# Patient Record
Sex: Male | Born: 1975 | Race: Black or African American | Hispanic: No | Marital: Single | Smoking: Current every day smoker
Health system: Southern US, Community
[De-identification: ages and names within clinical notes are randomized; demographics above are authoritative.]

## PROBLEM LIST (undated history)

## (undated) DIAGNOSIS — Z789 Other specified health status: Secondary | ICD-10-CM

## (undated) HISTORY — PX: NO PAST SURGERIES: SHX2092

---

## 2007-02-10 ENCOUNTER — Emergency Department: Payer: Self-pay | Admitting: Emergency Medicine

## 2012-05-19 ENCOUNTER — Encounter (HOSPITAL_COMMUNITY): Payer: Self-pay | Admitting: *Deleted

## 2012-05-19 ENCOUNTER — Observation Stay (HOSPITAL_COMMUNITY): Payer: No Typology Code available for payment source | Admitting: Anesthesiology

## 2012-05-19 ENCOUNTER — Emergency Department (HOSPITAL_COMMUNITY): Payer: No Typology Code available for payment source

## 2012-05-19 ENCOUNTER — Observation Stay (HOSPITAL_COMMUNITY)
Admission: EM | Admit: 2012-05-19 | Discharge: 2012-05-20 | Disposition: A | Discharge status: Home / Self Care | Payer: No Typology Code available for payment source | Attending: Surgery | Admitting: Surgery

## 2012-05-19 ENCOUNTER — Encounter (HOSPITAL_COMMUNITY): Payer: Self-pay | Admitting: Anesthesiology

## 2012-05-19 ENCOUNTER — Encounter (HOSPITAL_COMMUNITY)
Admission: EM | Disposition: A | Discharge status: Home / Self Care | Payer: Self-pay | Source: Home / Self Care | Attending: Emergency Medicine

## 2012-05-19 DIAGNOSIS — IMO0002 Reserved for concepts with insufficient information to code with codable children: Secondary | ICD-10-CM | POA: Insufficient documentation

## 2012-05-19 DIAGNOSIS — S66909A Unspecified injury of unspecified muscle, fascia and tendon at wrist and hand level, unspecified hand, initial encounter: Secondary | ICD-10-CM | POA: Insufficient documentation

## 2012-05-19 DIAGNOSIS — S270XXA Traumatic pneumothorax, initial encounter: Secondary | ICD-10-CM | POA: Insufficient documentation

## 2012-05-19 DIAGNOSIS — S61509A Unspecified open wound of unspecified wrist, initial encounter: Secondary | ICD-10-CM | POA: Insufficient documentation

## 2012-05-19 DIAGNOSIS — S61409A Unspecified open wound of unspecified hand, initial encounter: Principal | ICD-10-CM | POA: Insufficient documentation

## 2012-05-19 DIAGNOSIS — S0003XA Contusion of scalp, initial encounter: Secondary | ICD-10-CM | POA: Insufficient documentation

## 2012-05-19 DIAGNOSIS — W268XXA Contact with other sharp object(s), not elsewhere classified, initial encounter: Secondary | ICD-10-CM | POA: Insufficient documentation

## 2012-05-19 DIAGNOSIS — S060X9A Concussion with loss of consciousness of unspecified duration, initial encounter: Secondary | ICD-10-CM | POA: Insufficient documentation

## 2012-05-19 HISTORY — PX: LACERATION REPAIR: SHX5284

## 2012-05-19 HISTORY — PX: I & D EXTREMITY: SHX5045

## 2012-05-19 HISTORY — PX: TENDON REPAIR: SHX5111

## 2012-05-19 HISTORY — DX: Other specified health status: Z78.9

## 2012-05-19 HISTORY — PX: FOREIGN BODY REMOVAL: SHX962

## 2012-05-19 HISTORY — PX: WOUND EXPLORATION: SHX6188

## 2012-05-19 LAB — CREATININE, SERUM
GFR calc Af Amer: >90 mL/min (ref >90)
GFR calc non Af Amer: >90 mL/min (ref >90)

## 2012-05-19 LAB — CBC WITH DIFFERENTIAL/PLATELET
Basophils Relative: 0 % (ref 0–1)
Eosinophils Absolute: 0 10*3/uL (ref 0.0–0.7)
Eosinophils Relative: 0 % (ref 0–5)
HCT: 33.8 % — ABNORMAL LOW (ref 39.0–52.0)
Hemoglobin: 10.9 g/dL — ABNORMAL LOW (ref 13.0–17.0)
MCH: 24.7 pg — ABNORMAL LOW (ref 26.0–34.0)
MCHC: 32.2 g/dL (ref 30.0–36.0)
MCV: 76.5 fL — ABNORMAL LOW (ref 78.0–100.0)
Monocytes Absolute: 1.1 10*3/uL — ABNORMAL HIGH (ref 0.1–1.0)
Monocytes Relative: 7 % (ref 3–12)
Neutro Abs: 12.6 10*3/uL — ABNORMAL HIGH (ref 1.7–7.7)

## 2012-05-19 LAB — CBC
HCT: 32.8 % — ABNORMAL LOW (ref 39.0–52.0)
MCV: 76.6 fL — ABNORMAL LOW (ref 78.0–100.0)
RBC: 4.28 MIL/uL (ref 4.22–5.81)
WBC: 14.3 10*3/uL — ABNORMAL HIGH (ref 4.0–10.5)

## 2012-05-19 LAB — POCT I-STAT, CHEM 8
BUN: 8 mg/dL (ref 6–23)
Chloride: 107 mEq/L (ref 96–112)
Creatinine, Ser: 1.4 mg/dL — ABNORMAL HIGH (ref 0.50–1.35)
Glucose, Bld: 94 mg/dL (ref 70–99)
Potassium: 3.5 mEq/L (ref 3.5–5.1)

## 2012-05-19 LAB — TYPE AND SCREEN

## 2012-05-19 SURGERY — REPAIR, LACERATION, 2 OR MORE
Anesthesia: General | Site: Hand | Laterality: Bilateral | Wound class: Contaminated

## 2012-05-19 MED ORDER — ONDANSETRON HCL 4 MG PO TABS: 4.0000 mg | ORAL_TABLET | Freq: Four times a day (QID) | ORAL | Status: DC | PRN

## 2012-05-19 MED ORDER — HYDROMORPHONE HCL PF 1 MG/ML IJ SOLN
0.2500 mg | INTRAMUSCULAR | Status: DC | PRN
  Administered 2012-05-19 (×4): 0.5 mg via INTRAVENOUS

## 2012-05-19 MED ORDER — SODIUM CHLORIDE 0.9 % IV BOLUS (SEPSIS)
500.0000 mL | Freq: Once | INTRAVENOUS | Status: AC
  Administered 2012-05-19: 500 mL via INTRAVENOUS

## 2012-05-19 MED ORDER — SODIUM CHLORIDE 0.9 % IR SOLN
Status: DC | PRN
  Administered 2012-05-19: 3000 mL

## 2012-05-19 MED ORDER — KCL IN DEXTROSE-NACL 20-5-0.45 MEQ/L-%-% IV SOLN
INTRAVENOUS | Status: DC
  Filled 2012-05-19 (×4): qty 1000

## 2012-05-19 MED ORDER — ONDANSETRON HCL 4 MG/2ML IJ SOLN: 4.0000 mg | Freq: Four times a day (QID) | INTRAMUSCULAR | Status: DC | PRN

## 2012-05-19 MED ORDER — LIDOCAINE HCL (CARDIAC) 20 MG/ML IV SOLN
INTRAVENOUS | Status: DC | PRN
  Administered 2012-05-19: 100 mg via INTRAVENOUS

## 2012-05-19 MED ORDER — CEFAZOLIN SODIUM 1-5 GM-% IV SOLN
1.0000 g | Freq: Three times a day (TID) | INTRAVENOUS | Status: DC
  Administered 2012-05-19 – 2012-05-20 (×2): 1 g via INTRAVENOUS
  Filled 2012-05-19 (×3): qty 50

## 2012-05-19 MED ORDER — PROPOFOL 10 MG/ML IV BOLUS
INTRAVENOUS | Status: DC | PRN
  Administered 2012-05-19: 30 mg via INTRAVENOUS
  Administered 2012-05-19: 170 mg via INTRAVENOUS

## 2012-05-19 MED ORDER — DOCUSATE SODIUM 100 MG PO CAPS
100.0000 mg | ORAL_CAPSULE | Freq: Two times a day (BID) | ORAL | Status: DC
  Administered 2012-05-19 – 2012-05-20 (×2): 100 mg via ORAL
  Filled 2012-05-19 (×2): qty 1

## 2012-05-19 MED ORDER — CEFAZOLIN SODIUM-DEXTROSE 2-3 GM-% IV SOLR
2.0000 g | INTRAVENOUS | Status: DC
  Filled 2012-05-19: qty 50

## 2012-05-19 MED ORDER — MORPHINE SULFATE 2 MG/ML IJ SOLN
1.0000 mg | INTRAMUSCULAR | Status: DC | PRN
  Administered 2012-05-19 (×3): 2 mg via INTRAVENOUS
  Filled 2012-05-19 (×3): qty 1
  Filled 2012-05-19: qty 2

## 2012-05-19 MED ORDER — BUPIVACAINE HCL 0.25 % IJ SOLN
INTRAMUSCULAR | Status: DC | PRN
  Administered 2012-05-19: 13 mL
  Administered 2012-05-19: 5 mL

## 2012-05-19 MED ORDER — MEPERIDINE HCL 25 MG/ML IJ SOLN: 6.2500 mg | INTRAMUSCULAR | Status: DC | PRN

## 2012-05-19 MED ORDER — BISACODYL 10 MG RE SUPP: 10.0000 mg | Freq: Every day | RECTAL | Status: DC | PRN

## 2012-05-19 MED ORDER — SUCCINYLCHOLINE CHLORIDE 20 MG/ML IJ SOLN
INTRAMUSCULAR | Status: DC | PRN
  Administered 2012-05-19: 120 mg via INTRAVENOUS

## 2012-05-19 MED ORDER — HYDROMORPHONE HCL PF 1 MG/ML IJ SOLN
1.0000 mg | Freq: Once | INTRAMUSCULAR | Status: AC
  Administered 2012-05-19: 1 mg via INTRAVENOUS
  Filled 2012-05-19: qty 1

## 2012-05-19 MED ORDER — IOHEXOL 300 MG/ML  SOLN
100.0000 mL | Freq: Once | INTRAMUSCULAR | Status: AC | PRN
  Administered 2012-05-19: 100 mL via INTRAVENOUS

## 2012-05-19 MED ORDER — PROMETHAZINE HCL 25 MG/ML IJ SOLN: 6.2500 mg | INTRAMUSCULAR | Status: DC | PRN

## 2012-05-19 MED ORDER — FENTANYL CITRATE 0.05 MG/ML IJ SOLN
INTRAMUSCULAR | Status: DC | PRN
  Administered 2012-05-19: 50 ug via INTRAVENOUS
  Administered 2012-05-19: 100 ug via INTRAVENOUS
  Administered 2012-05-19: 50 ug via INTRAVENOUS

## 2012-05-19 MED ORDER — OXYCODONE HCL 5 MG PO TABS: 5.0000 mg | ORAL_TABLET | Freq: Once | ORAL | Status: DC | PRN

## 2012-05-19 MED ORDER — HYDROCODONE-ACETAMINOPHEN 5-325 MG PO TABS
2.0000 | ORAL_TABLET | ORAL | Status: DC | PRN
  Administered 2012-05-19 – 2012-05-20 (×4): 2 via ORAL
  Filled 2012-05-19 (×4): qty 2

## 2012-05-19 MED ORDER — CEFAZOLIN SODIUM 1-5 GM-% IV SOLN
1.0000 g | Freq: Once | INTRAVENOUS | Status: AC
  Administered 2012-05-19 (×2): 1 g via INTRAVENOUS
  Filled 2012-05-19: qty 50

## 2012-05-19 MED ORDER — LACTATED RINGERS IV SOLN: INTRAVENOUS | Status: DC | PRN

## 2012-05-19 MED ORDER — HYDROCODONE-ACETAMINOPHEN 5-325 MG PO TABS: 1.0000 | ORAL_TABLET | ORAL | Status: DC | PRN

## 2012-05-19 MED ORDER — CHLORHEXIDINE GLUCONATE 4 % EX LIQD: 60.0000 mL | Freq: Once | CUTANEOUS | Status: DC

## 2012-05-19 MED ORDER — OXYCODONE HCL 5 MG/5ML PO SOLN: 5.0000 mg | Freq: Once | ORAL | Status: DC | PRN

## 2012-05-19 MED ORDER — HYDROCODONE-ACETAMINOPHEN 5-325 MG PO TABS: 0.5000 | ORAL_TABLET | ORAL | Status: DC | PRN

## 2012-05-19 MED ORDER — ENOXAPARIN SODIUM 40 MG/0.4ML ~~LOC~~ SOLN
40.0000 mg | SUBCUTANEOUS | Status: DC
  Administered 2012-05-20: 40 mg via SUBCUTANEOUS
  Filled 2012-05-19 (×2): qty 0.4

## 2012-05-19 MED ORDER — 0.9 % SODIUM CHLORIDE (POUR BTL) OPTIME
TOPICAL | Status: DC | PRN
  Administered 2012-05-19: 1000 mL

## 2012-05-19 SURGICAL SUPPLY — 32 items
BANDAGE ELASTIC 4 VELCRO ST LF (GAUZE/BANDAGES/DRESSINGS) ×12 IMPLANT
BANDAGE GAUZE ELAST BULKY 4 IN (GAUZE/BANDAGES/DRESSINGS) ×6 IMPLANT
BNDG ESMARK 4X9 LF (GAUZE/BANDAGES/DRESSINGS) ×3 IMPLANT
COVER SURGICAL LIGHT HANDLE (MISCELLANEOUS) ×3 IMPLANT
CUFF TOURNIQUET SINGLE 18IN (TOURNIQUET CUFF) ×6 IMPLANT
DRAPE EXTREMITY T 121X128X90 (DRAPE) ×3 IMPLANT
DRAPE OEC MINIVIEW 54X84 (DRAPES) ×6 IMPLANT
DRSG EMULSION OIL 3X3 NADH (GAUZE/BANDAGES/DRESSINGS) ×6 IMPLANT
GAUZE XEROFORM 1X8 LF (GAUZE/BANDAGES/DRESSINGS) ×6 IMPLANT
GLOVE BIO SURGEON STRL SZ7 (GLOVE) ×9 IMPLANT
GLOVE BIO SURGEON STRL SZ7.5 (GLOVE) ×6 IMPLANT
GLOVE BIOGEL PI IND STRL 8 (GLOVE) ×4 IMPLANT
GLOVE BIOGEL PI INDICATOR 8 (GLOVE) ×2
GOWN STRL NON-REIN LRG LVL3 (GOWN DISPOSABLE) ×12 IMPLANT
KIT BASIN OR (CUSTOM PROCEDURE TRAY) ×3 IMPLANT
KIT ROOM TURNOVER OR (KITS) ×3 IMPLANT
PACK ORTHO EXTREMITY (CUSTOM PROCEDURE TRAY) ×3 IMPLANT
PAD ARMBOARD 7.5X6 YLW CONV (MISCELLANEOUS) ×3 IMPLANT
PAD CAST 4YDX4 CTTN HI CHSV (CAST SUPPLIES) ×8 IMPLANT
PADDING CAST COTTON 4X4 STRL (CAST SUPPLIES) ×4
SPLINT PLASTER EXTRA FAST 3X15 (CAST SUPPLIES) ×2
SPLINT PLASTER GYPS XFAST 3X15 (CAST SUPPLIES) ×4 IMPLANT
SPONGE GAUZE 4X4 12PLY (GAUZE/BANDAGES/DRESSINGS) ×6 IMPLANT
SUCTION FRAZIER TIP 10 FR DISP (SUCTIONS) ×6 IMPLANT
SUT ETHIBOND 3-0 V-5 (SUTURE) ×6 IMPLANT
SUT ETHILON 3 0 FSL (SUTURE) ×3 IMPLANT
SUT SILK 2 0 SH (SUTURE) ×3 IMPLANT
SUT VIC AB 2-0 CT1 36 (SUTURE) ×3 IMPLANT
SUT VIC AB 3-0 FS2 27 (SUTURE) ×3 IMPLANT
TOWEL OR 17X26 10 PK STRL BLUE (TOWEL DISPOSABLE) ×3 IMPLANT
TOWEL OR 17X26 4PK STRL BLUE (TOWEL DISPOSABLE) ×3 IMPLANT
TUBING CYSTO DISP (UROLOGICAL SUPPLIES) ×6 IMPLANT
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
## 2012-05-19 NOTE — Transfer of Care (Signed)
Immediate Anesthesia Transfer of Care Note  Patient: James Valenzuela  Procedure(s) Performed: Procedure(s) (LRB) with comments: REMOVAL FOREIGN BODY EXTREMITY (Bilateral) IRRIGATION AND DEBRIDEMENT EXTREMITY (Bilateral) TENDON REPAIR (Bilateral) - Multiple tendon repair  Patient Location: PACU  Anesthesia Type:General  Level of Consciousness: responds to stimulation  Airway & Oxygen Therapy: Patient Spontanous Breathing and Patient connected to nasal cannula oxygen  Post-op Assessment: Report given to PACU RN and Post -op Vital signs reviewed and stable  Post vital signs: Reviewed and stable  Complications: No apparent anesthesia complications

## 2012-05-19 NOTE — ED Notes (Signed)
PT. TRANSPORTED TO RADIOLOGY, SPOUSE/MOTHER -IN - LAW CAME TO SEE PT.
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
## 2012-05-19 NOTE — Anesthesia Procedure Notes (Signed)
Procedure Name: Intubation Date/Time: 05/19/2012 11:10 AM Performed by: Arlice Colt B Pre-anesthesia Checklist: Patient identified, Emergency Drugs available, Suction available, Patient being monitored and Timeout performed Patient Re-evaluated:Patient Re-evaluated prior to inductionOxygen Delivery Method: Circle system utilized Preoxygenation: Pre-oxygenation with 100% oxygen Intubation Type: IV induction and Rapid sequence Laryngoscope Size: Mac and 3 Grade View: Grade I Tube type: Oral Tube size: 7.5 mm Number of attempts: 1 Airway Equipment and Method: Stylet Placement Confirmation: ETT inserted through vocal cords under direct vision,  positive ETCO2 and breath sounds checked- equal and bilateral Secured at: 23 cm Tube secured with: Tape Dental Injury: Teeth and Oropharynx as per pre-operative assessment

## 2012-05-19 NOTE — ED Provider Notes (Signed)
°  8:22 AM Dr. Donell Beers has requested ENT evaluation due to air at pterygoid musculature on facial CT scan.  Dr. Jenne Pane to review CT and call me back.  Dr. Merlyn Lot to evaluate pt as well.     8:30 AM Dr. Jenne Pane doesn't feel anything of major concern based on CT scan.  Will see pt in the hospital later today.    Gavin Pound. Oletta Lamas, MD 05/19/12 [id number]
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
## 2012-05-19 NOTE — Anesthesia Postprocedure Evaluation (Signed)
°  Anesthesia Post-op Note  Patient: James Valenzuela  Procedure(s) Performed: Procedure(s) (LRB) with comments: REMOVAL FOREIGN BODY EXTREMITY (Bilateral) IRRIGATION AND DEBRIDEMENT EXTREMITY (Bilateral) TENDON REPAIR (Bilateral) - Multiple tendon repair WOUND EXPLORATION (Bilateral) REPAIR MULTIPLE LACERATIONS (Bilateral)  Patient Location: PACU  Anesthesia Type:General  Level of Consciousness: awake, alert  and oriented  Airway and Oxygen Therapy: Patient Spontanous Breathing and Patient connected to nasal cannula oxygen  Post-op Pain: moderate  Post-op Assessment: Post-op Vital signs reviewed, Patient's Cardiovascular Status Stable, Respiratory Function Stable, Patent Airway and No signs of Nausea or vomiting  Post-op Vital Signs: Reviewed and stable  Complications: No apparent anesthesia complications
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
## 2012-05-19 NOTE — Op Note (Signed)
Dictation [phone]
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
## 2012-05-20 ENCOUNTER — Observation Stay (HOSPITAL_COMMUNITY): Payer: No Typology Code available for payment source

## 2012-05-20 MED ORDER — OXYCODONE-ACETAMINOPHEN 5-325 MG PO TABS: 1.0000 | ORAL_TABLET | ORAL | Status: AC | PRN

## 2012-05-20 MED ORDER — CEPHALEXIN 500 MG PO CAPS: 500.0000 mg | ORAL_CAPSULE | Freq: Four times a day (QID) | ORAL | Status: AC

## 2012-05-20 NOTE — Discharge Summary (Signed)
Physician Discharge Summary  Patient ID: James Valenzuela MRN: [id number] DOB/AGE: 12-23-1975 36 y.o.  Admit date: 05/19/2012 Discharge date: 05/20/2012  Admission Diagnoses:  Discharge Diagnoses:  Hand wounds pneumothorax Active Problems:  * No active hospital problems. *    Discharged Condition: good  Hospital Course: admitted for observation.  Occult pneumothorax did not need treatment.  Hand surgery bilaterally by Dr. Merlyn Lot  Consults: hand  Significant Diagnostic Studies:   Treatments: surgery:   Discharge Exam: Blood pressure 128/86, pulse 71, temperature 98.8 F (37.1 C), temperature source Oral, resp. rate 18, height 6\' 1"  (1.854 m), weight 170 lb (77.111 kg), SpO2 96.00%. General appearance: alert and no distress Resp: clear to auscultation bilaterally Extremities: dressings in place  Disposition: Final discharge disposition not confirmed     Medication List     As of 05/20/2012  9:55 AM    TAKE these medications         cephALEXin 500 MG capsule   Commonly known as: KEFLEX   Take 1 capsule (500 mg total) by mouth 4 (four) times daily.      oxyCODONE-acetaminophen 5-325 MG per tablet   Commonly known as: PERCOCET/ROXICET   Take 1 tablet by mouth every 4 (four) hours as needed for pain (1 to 2 tablets every 4 hours as needed for pain).           Follow-up Information    Follow up with Tami Ribas, MD. In 1 week.   Contact information:   [address] [place] [address] [phone]          Signed: Shelly Rubenstein 05/20/2012, 9:55 AM

## 2012-05-20 NOTE — Progress Notes (Signed)
Patient ID: James Valenzuela, male   DOB: March 09, 1976, 36 y.o.   MRN: [id number] I personally reviewed the patient's maxillofacial CT yesterday showing air in the left pterygoid musculature but without any evident facial or skull base fracture.  The patient was in the operating room with Dr. Merlyn Lot and was not available for evaluation.  Possible reasons for the air include tracking from the scalp laceration (seems most likely) or a subclinical fracture that would allow air into the soft tissues.  Either way, I do not feel that anything needs to be done about it, specifically.
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
## 2012-05-20 NOTE — Discharge Planning (Signed)
Patient discharged home in stable condition. Verbalizes understanding of all discharge instructions, including home medications and follow up appointments.

## 2012-05-21 NOTE — Patient Instructions (Signed)
°

## 2012-05-21 NOTE — Consent Form (Signed)
°

## 2012-05-21 NOTE — Progress Notes (Signed)
°

## 2012-05-21 NOTE — Procedures (Signed)
°

## 2012-05-22 ENCOUNTER — Encounter (HOSPITAL_COMMUNITY): Payer: Self-pay | Admitting: Orthopedic Surgery

## 2013-05-10 ENCOUNTER — Emergency Department: Payer: Self-pay | Admitting: Emergency Medicine

## 2013-05-10 LAB — URINALYSIS, COMPLETE
Bacteria: NONE SEEN
Blood: NEGATIVE
Leukocyte Esterase: NEGATIVE
Nitrite: NEGATIVE
Ph: 8 (ref 4.5–8.0)
Protein: NEGATIVE
Squamous Epithelial: NONE SEEN
WBC UR: 1 /HPF (ref 0–5)
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
## 2018-11-28 ENCOUNTER — Encounter (HOSPITAL_COMMUNITY): Payer: Self-pay | Admitting: *Deleted

## 2018-11-28 ENCOUNTER — Emergency Department (HOSPITAL_COMMUNITY): Payer: Self-pay

## 2018-11-28 ENCOUNTER — Other Ambulatory Visit: Payer: Self-pay

## 2018-11-28 ENCOUNTER — Emergency Department (HOSPITAL_COMMUNITY)
Admission: EM | Admit: 2018-11-28 | Discharge: 2018-11-29 | Disposition: A | Discharge status: Home / Self Care | Payer: Self-pay | Attending: Emergency Medicine | Admitting: Emergency Medicine

## 2018-11-28 DIAGNOSIS — R4182 Altered mental status, unspecified: Secondary | ICD-10-CM | POA: Insufficient documentation

## 2018-11-28 DIAGNOSIS — Y939 Activity, unspecified: Secondary | ICD-10-CM | POA: Insufficient documentation

## 2018-11-28 DIAGNOSIS — F10920 Alcohol use, unspecified with intoxication, uncomplicated: Secondary | ICD-10-CM | POA: Insufficient documentation

## 2018-11-28 DIAGNOSIS — S0101XA Laceration without foreign body of scalp, initial encounter: Secondary | ICD-10-CM

## 2018-11-28 DIAGNOSIS — Z23 Encounter for immunization: Secondary | ICD-10-CM | POA: Insufficient documentation

## 2018-11-28 DIAGNOSIS — S060X9A Concussion with loss of consciousness of unspecified duration, initial encounter: Secondary | ICD-10-CM

## 2018-11-28 DIAGNOSIS — F19920 Other psychoactive substance use, unspecified with intoxication, uncomplicated: Secondary | ICD-10-CM

## 2018-11-28 DIAGNOSIS — Y929 Unspecified place or not applicable: Secondary | ICD-10-CM | POA: Insufficient documentation

## 2018-11-28 DIAGNOSIS — Y999 Unspecified external cause status: Secondary | ICD-10-CM | POA: Insufficient documentation

## 2018-11-28 DIAGNOSIS — F14920 Cocaine use, unspecified with intoxication, uncomplicated: Secondary | ICD-10-CM | POA: Insufficient documentation

## 2018-11-28 LAB — TYPE AND SCREEN
ABO/RH(D): O POS
Antibody Screen: NEGATIVE
Unit division: 0
Unit division: 0

## 2018-11-28 LAB — I-STAT CHEM 8, ED
BUN: 6 mg/dL (ref 6–20)
Calcium, Ion: 1.01 mmol/L — ABNORMAL LOW (ref 1.15–1.40)
Chloride: 105 mmol/L (ref 98–111)
Creatinine, Ser: 1.6 mg/dL — ABNORMAL HIGH (ref 0.61–1.24)
Glucose, Bld: 100 mg/dL — ABNORMAL HIGH (ref 70–99)
HCT: 41 % (ref 39.0–52.0)
Hemoglobin: 13.9 g/dL (ref 13.0–17.0)
Potassium: 3.8 mmol/L (ref 3.5–5.1)
Sodium: 139 mmol/L (ref 135–145)
TCO2: 21 mmol/L — ABNORMAL LOW (ref 22–32)

## 2018-11-28 LAB — PREPARE FRESH FROZEN PLASMA
Unit division: 0
Unit division: 0

## 2018-11-28 LAB — ABO/RH: ABO/RH(D): O POS

## 2018-11-28 LAB — BPAM FFP
Blood Product Expiration Date: 202007112359
Blood Product Expiration Date: 202007112359
ISSUE DATE / TIME: 202006172137
ISSUE DATE / TIME: 202006172137
Unit Type and Rh: 600
Unit Type and Rh: 6200

## 2018-11-28 LAB — BPAM RBC
Blood Product Expiration Date: 202007152359
Blood Product Expiration Date: 202007152359
ISSUE DATE / TIME: 202006172137
ISSUE DATE / TIME: 202006172137
Unit Type and Rh: 5100
Unit Type and Rh: 5100

## 2018-11-28 LAB — CBC
HCT: 37.2 % — ABNORMAL LOW (ref 39.0–52.0)
Hemoglobin: 11.5 g/dL — ABNORMAL LOW (ref 13.0–17.0)
MCH: 25.2 pg — ABNORMAL LOW (ref 26.0–34.0)
MCHC: 30.9 g/dL (ref 30.0–36.0)
MCV: 81.4 fL (ref 80.0–100.0)
Platelets: 220 10*3/uL (ref 150–400)
RBC: 4.57 MIL/uL (ref 4.22–5.81)
RDW: 13.5 % (ref 11.5–15.5)
WBC: 8.1 10*3/uL (ref 4.0–10.5)
nRBC: 0 % (ref 0.0–0.2)

## 2018-11-28 LAB — COMPREHENSIVE METABOLIC PANEL
ALT: 41 U/L (ref 0–44)
AST: 35 U/L (ref 15–41)
Albumin: 3.9 g/dL (ref 3.5–5.0)
Alkaline Phosphatase: 65 U/L (ref 38–126)
Anion gap: 12 (ref 5–15)
BUN: 7 mg/dL (ref 6–20)
CO2: 20 mmol/L — ABNORMAL LOW (ref 22–32)
Calcium: 8.7 mg/dL — ABNORMAL LOW (ref 8.9–10.3)
Chloride: 107 mmol/L (ref 98–111)
Creatinine, Ser: 1.2 mg/dL (ref 0.61–1.24)
GFR calc Af Amer: 42 mL/min — ABNORMAL LOW (ref >60)
GFR calc non Af Amer: 36 mL/min — ABNORMAL LOW (ref >60)
Glucose, Bld: 104 mg/dL — ABNORMAL HIGH (ref 70–99)
Potassium: 3.8 mmol/L (ref 3.5–5.1)
Sodium: 139 mmol/L (ref 135–145)
Total Bilirubin: 0.2 mg/dL — ABNORMAL LOW (ref 0.3–1.2)
Total Protein: 7.3 g/dL (ref 6.5–8.1)

## 2018-11-28 LAB — PROTIME-INR
INR: 1.1 (ref 0.8–1.2)
Prothrombin Time: 14 seconds (ref 11.4–15.2)

## 2018-11-28 LAB — CDS SEROLOGY

## 2018-11-28 LAB — LACTIC ACID, PLASMA: Lactic Acid, Venous: 2.3 mmol/L (ref 0.5–1.9)

## 2018-11-28 LAB — ETHANOL: Alcohol, Ethyl (B): 219 mg/dL — ABNORMAL HIGH (ref <10)

## 2018-11-28 MED ORDER — LACTATED RINGERS IV BOLUS
1000.0000 mL | Freq: Once | INTRAVENOUS | Status: AC
  Administered 2018-11-29: 1000 mL via INTRAVENOUS

## 2018-11-28 MED ORDER — LACTATED RINGERS IV BOLUS
2000.0000 mL | Freq: Once | INTRAVENOUS | Status: AC
  Administered 2018-11-29: 02:00:00 1000 mL via INTRAVENOUS

## 2018-11-28 MED ORDER — TETANUS-DIPHTH-ACELL PERTUSSIS 5-2.5-18.5 LF-MCG/0.5 IM SUSP
0.5000 mL | Freq: Once | INTRAMUSCULAR | Status: AC
  Administered 2018-11-29: 0.5 mL via INTRAMUSCULAR
  Filled 2018-11-28: qty 0.5
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
## 2018-11-29 LAB — URINALYSIS, ROUTINE W REFLEX MICROSCOPIC
Bilirubin Urine: NEGATIVE
Glucose, UA: NEGATIVE mg/dL
Hgb urine dipstick: NEGATIVE
Ketones, ur: NEGATIVE mg/dL
Leukocytes,Ua: NEGATIVE
Nitrite: NEGATIVE
Protein, ur: NEGATIVE mg/dL
Specific Gravity, Urine: 1.01 (ref 1.005–1.030)
pH: 5 (ref 5.0–8.0)

## 2018-11-29 LAB — RAPID URINE DRUG SCREEN, HOSP PERFORMED
Amphetamines: NOT DETECTED
Barbiturates: NOT DETECTED
Benzodiazepines: POSITIVE — AB
Cocaine: POSITIVE — AB
Opiates: NOT DETECTED
Tetrahydrocannabinol: NOT DETECTED

## 2018-11-29 NOTE — ED Notes (Signed)
The pts wife is coming to get him from [place]

## 2018-11-29 NOTE — ED Notes (Signed)
Pt sleeping. 
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
## 2018-11-29 NOTE — ED Notes (Signed)
The pt is moving around some  Moving his rt arm

## 2024-03-19 ENCOUNTER — Other Ambulatory Visit: Payer: Self-pay

## 2024-03-19 ENCOUNTER — Emergency Department
Admission: EM | Admit: 2024-03-19 | Discharge: 2024-03-19 | Disposition: A | Discharge status: Home / Self Care | Payer: Self-pay | Attending: Emergency Medicine | Admitting: Emergency Medicine

## 2024-03-19 DIAGNOSIS — R55 Syncope and collapse: Secondary | ICD-10-CM | POA: Insufficient documentation

## 2024-03-19 LAB — TROPONIN I (HIGH SENSITIVITY): Troponin I (High Sensitivity): 8 ng/L (ref <18)

## 2024-03-19 LAB — COMPREHENSIVE METABOLIC PANEL WITH GFR
ALT: 80 U/L — ABNORMAL HIGH (ref 0–44)
AST: 86 U/L — ABNORMAL HIGH (ref 15–41)
Albumin: 3.8 g/dL (ref 3.5–5.0)
Alkaline Phosphatase: 61 U/L (ref 38–126)
Anion gap: 12 (ref 5–15)
BUN: 8 mg/dL (ref 6–20)
CO2: 22 mmol/L (ref 22–32)
Calcium: 8.7 mg/dL — ABNORMAL LOW (ref 8.9–10.3)
Chloride: 101 mmol/L (ref 98–111)
Creatinine, Ser: 0.93 mg/dL (ref 0.61–1.24)
GFR, Estimated: >60 mL/min (ref >60)
Glucose, Bld: 98 mg/dL (ref 70–99)
Potassium: 3.5 mmol/L (ref 3.5–5.1)
Sodium: 135 mmol/L (ref 135–145)
Total Bilirubin: 1 mg/dL (ref 0.0–1.2)
Total Protein: 7.7 g/dL (ref 6.5–8.1)

## 2024-03-19 LAB — CBC
HCT: 33.4 % — ABNORMAL LOW (ref 39.0–52.0)
Hemoglobin: 10.7 g/dL — ABNORMAL LOW (ref 13.0–17.0)
MCH: 25.7 pg — ABNORMAL LOW (ref 26.0–34.0)
MCHC: 32 g/dL (ref 30.0–36.0)
MCV: 80.1 fL (ref 80.0–100.0)
Platelets: 178 K/uL (ref 150–400)
RBC: 4.17 MIL/uL — ABNORMAL LOW (ref 4.22–5.81)
RDW: 13.9 % (ref 11.5–15.5)
WBC: 8.9 K/uL (ref 4.0–10.5)
nRBC: 0 % (ref 0.0–0.2)

## 2024-03-19 MED ORDER — SODIUM CHLORIDE 0.9 % IV BOLUS
500.0000 mL | Freq: Once | INTRAVENOUS | Status: AC
  Administered 2024-03-19: 500 mL via INTRAVENOUS

## 2024-03-19 NOTE — ED Triage Notes (Signed)
 Pt arrives via EMS from the [place] in police custody. Nurse at the jail reported that the patient had a brief moment of generalized weakness and diaphoresis while doing his intake process. Pt arrives AxOx4. Pt states he is feeling better. Does report alcohol, cocaine, and marijuana use yesterday. BG with EMS was 118. No Focal neurological deficits noted.

## 2024-03-19 NOTE — ED Provider Notes (Signed)
 [facility] Provider Note    Event Date/Time   First MD Initiated Contact with Patient 03/19/24 1244     (approximate)   History   Fatigue   HPI  James Valenzuela is a 48 y.o. male with history of substance abuse, here under police custody.  Admits to drinking alcohol using cocaine and marijuana last night.  Was being booked into jail when he had an episode of lightheadedness.  Blood pressure remained stable reportedly.  Sent to the emergency department for evaluation.  Medical records reviewed, no recent hospital admission     Physical Exam   Triage Vital Signs: ED Triage Vitals  Encounter Vitals Group     BP 03/19/24 1247 (!) 138/99     Girls Systolic BP Percentile --      Girls Diastolic BP Percentile --      Boys Systolic BP Percentile --      Boys Diastolic BP Percentile --      Pulse Rate 03/19/24 1247 66     Resp 03/19/24 1247 17     Temp 03/19/24 1247 98.1 F (36.7 C)     Temp src --      SpO2 03/19/24 1242 98 %     Weight 03/19/24 1248 79.4 kg (175 lb)     Height 03/19/24 1248 1.829 m (6')     Head Circumference --      Peak Flow --      Pain Score 03/19/24 1248 0     Pain Loc --      Pain Education --      Exclude from Growth Chart --     Most recent vital signs: Vitals:   03/19/24 1242 03/19/24 1247  BP:  (!) 138/99  Pulse:  66  Resp:  17  Temp:  98.1 F (36.7 C)  SpO2: 98% 100%     General: Awake, no distress.  Well-appearing CV:  Good peripheral perfusion.  Regular rate and rhythm Resp:  Normal effort.  Abd:  No distention.  Soft, nontender, Other:     ED Results / Procedures / Treatments   Labs (all labs ordered are listed, but only abnormal results are displayed) Labs Reviewed  CBC - Abnormal; Notable for the following components:      Result Value   RBC 4.17 (*)    Hemoglobin 10.7 (*)    HCT 33.4 (*)    MCH 25.7 (*)    All other components within normal limits  COMPREHENSIVE METABOLIC PANEL WITH GFR -  Abnormal; Notable for the following components:   Calcium 8.7 (*)    AST 86 (*)    ALT 80 (*)    All other components within normal limits  TROPONIN I (HIGH SENSITIVITY)     EKG  ED ECG REPORT I, Lamar Price, the attending physician, personally viewed and interpreted this ECG.  Date: 03/19/2024  Rhythm: normal sinus rhythm QRS Axis: normal Intervals: normal ST/T Wave abnormalities: normal Narrative Interpretation: no evidence of acute ischemia    RADIOLOGY     PROCEDURES:  Critical Care performed:   Procedures   MEDICATIONS ORDERED IN ED: Medications  sodium chloride  0.9 % bolus 500 mL (500 mLs Intravenous New Bag/Given 03/19/24 1315)     IMPRESSION / MDM / ASSESSMENT AND PLAN / ED COURSE  I reviewed the triage vital signs and the nursing notes. Patient's presentation is most consistent with acute illness / injury with system symptoms.  Patient presents after near  syncopal episode as detailed above, likely related to polysubstance abuse.  Will treat with IV fluids, obtain labs EKG and monitor carefully  EKG is reassuring  Lab work is unremarkable, patient feeling better after fluids, appropriate for discharge at this time, no indication for admission      FINAL CLINICAL IMPRESSION(S) / ED DIAGNOSES   Final diagnoses:  Near syncope     Rx / DC Orders   ED Discharge Orders     None        Note:  This document was prepared using Dragon voice recognition software and may include unintentional dictation errors.   Arlander Charleston, MD 03/19/24 1420
